# Patient Record
Sex: Female | Born: 1975 | Race: Black or African American | Hispanic: No | Marital: Single | Smoking: Current every day smoker
Health system: Southern US, Community
[De-identification: ages and names within clinical notes are randomized; demographics above are authoritative.]

## PROBLEM LIST (undated history)

## (undated) HISTORY — PX: TUBAL LIGATION: SHX77

---

## 1998-01-31 ENCOUNTER — Emergency Department (HOSPITAL_COMMUNITY): Admission: EM | Admit: 1998-01-31 | Discharge: 1998-01-31 | Payer: Self-pay | Admitting: Emergency Medicine

## 1999-01-01 ENCOUNTER — Emergency Department (HOSPITAL_COMMUNITY): Admission: EM | Admit: 1999-01-01 | Discharge: 1999-01-02 | Payer: Self-pay | Admitting: Internal Medicine

## 2000-05-30 ENCOUNTER — Emergency Department (HOSPITAL_COMMUNITY): Admission: EM | Admit: 2000-05-30 | Discharge: 2000-05-30 | Payer: Self-pay | Admitting: Emergency Medicine

## 2001-01-14 ENCOUNTER — Emergency Department (HOSPITAL_COMMUNITY): Admission: EM | Admit: 2001-01-14 | Discharge: 2001-01-14 | Payer: Self-pay | Admitting: Emergency Medicine

## 2006-02-17 ENCOUNTER — Emergency Department (HOSPITAL_COMMUNITY): Admission: EM | Admit: 2006-02-17 | Discharge: 2006-02-17 | Payer: Self-pay | Admitting: Emergency Medicine

## 2007-06-23 ENCOUNTER — Emergency Department (HOSPITAL_COMMUNITY): Admission: EM | Admit: 2007-06-23 | Discharge: 2007-06-23 | Payer: Self-pay | Admitting: Emergency Medicine

## 2007-08-18 ENCOUNTER — Emergency Department (HOSPITAL_COMMUNITY): Admission: EM | Admit: 2007-08-18 | Discharge: 2007-08-18 | Payer: Self-pay | Admitting: Family Medicine

## 2008-02-01 ENCOUNTER — Emergency Department (HOSPITAL_COMMUNITY): Admission: EM | Admit: 2008-02-01 | Discharge: 2008-02-02 | Payer: Self-pay | Admitting: *Deleted

## 2009-06-01 ENCOUNTER — Emergency Department (HOSPITAL_COMMUNITY): Admission: EM | Admit: 2009-06-01 | Discharge: 2009-06-01 | Payer: Self-pay | Admitting: Emergency Medicine

## 2009-06-22 ENCOUNTER — Emergency Department (HOSPITAL_COMMUNITY): Admission: EM | Admit: 2009-06-22 | Discharge: 2009-06-22 | Payer: Self-pay | Admitting: Family Medicine

## 2010-09-04 LAB — POCT I-STAT, CHEM 8
BUN: 7 mg/dL (ref 6–23)
Calcium, Ion: 1.17 mmol/L (ref 1.12–1.32)
HCT: 39 % (ref 36.0–46.0)
Hemoglobin: 13.3 g/dL (ref 12.0–15.0)

## 2010-09-04 LAB — POCT CARDIAC MARKERS
CKMB, poc: <1 ng/mL — ABNORMAL LOW (ref 1.0–8.0)
Myoglobin, poc: 58.6 ng/mL (ref 12–200)
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
## 2022-01-04 ENCOUNTER — Emergency Department (HOSPITAL_COMMUNITY)
Admission: EM | Admit: 2022-01-04 | Discharge: 2022-01-05 | Disposition: A | Discharge status: Home / Self Care | Payer: Self-pay | Attending: Emergency Medicine | Admitting: Emergency Medicine

## 2022-01-04 ENCOUNTER — Other Ambulatory Visit: Payer: Self-pay

## 2022-01-04 DIAGNOSIS — N12 Tubulo-interstitial nephritis, not specified as acute or chronic: Secondary | ICD-10-CM | POA: Insufficient documentation

## 2022-01-04 LAB — COMPREHENSIVE METABOLIC PANEL
ALT: 12 U/L (ref 0–44)
AST: 17 U/L (ref 15–41)
Albumin: 3.2 g/dL — ABNORMAL LOW (ref 3.5–5.0)
Alkaline Phosphatase: 70 U/L (ref 38–126)
Anion gap: 8 (ref 5–15)
BUN: 10 mg/dL (ref 6–20)
CO2: 28 mmol/L (ref 22–32)
Calcium: 9.1 mg/dL (ref 8.9–10.3)
Chloride: 103 mmol/L (ref 98–111)
Creatinine, Ser: 0.82 mg/dL (ref 0.44–1.00)
GFR, Estimated: >60 mL/min (ref >60)
Glucose, Bld: 73 mg/dL (ref 70–99)
Potassium: 3 mmol/L — ABNORMAL LOW (ref 3.5–5.1)
Sodium: 139 mmol/L (ref 135–145)
Total Bilirubin: 0.5 mg/dL (ref 0.3–1.2)
Total Protein: 7.3 g/dL (ref 6.5–8.1)

## 2022-01-04 LAB — CBC
HCT: 37.2 % (ref 36.0–46.0)
Hemoglobin: 12 g/dL (ref 12.0–15.0)
MCH: 25.2 pg — ABNORMAL LOW (ref 26.0–34.0)
MCHC: 32.3 g/dL (ref 30.0–36.0)
MCV: 78 fL — ABNORMAL LOW (ref 80.0–100.0)
Platelets: 350 10*3/uL (ref 150–400)
RBC: 4.77 MIL/uL (ref 3.87–5.11)
RDW: 15 % (ref 11.5–15.5)
WBC: 12.8 10*3/uL — ABNORMAL HIGH (ref 4.0–10.5)
nRBC: 0 % (ref 0.0–0.2)

## 2022-01-04 LAB — URINALYSIS, ROUTINE W REFLEX MICROSCOPIC
Bilirubin Urine: NEGATIVE
Glucose, UA: NEGATIVE mg/dL
Ketones, ur: NEGATIVE mg/dL
Nitrite: NEGATIVE
Protein, ur: 30 mg/dL — AB
RBC / HPF: >50 RBC/hpf — ABNORMAL HIGH (ref 0–5)
Specific Gravity, Urine: 1.01 (ref 1.005–1.030)
WBC, UA: >50 WBC/hpf — ABNORMAL HIGH (ref 0–5)
pH: 7 (ref 5.0–8.0)

## 2022-01-04 LAB — LIPASE, BLOOD: Lipase: 24 U/L (ref 11–51)

## 2022-01-04 LAB — I-STAT BETA HCG BLOOD, ED (MC, WL, AP ONLY): I-stat hCG, quantitative: <5 m[IU]/mL (ref <5)

## 2022-01-04 NOTE — ED Triage Notes (Signed)
Pt states she has had abdominal pain, pressure and urinary frequency, and nausea since 7/28.  Pt states for the past 3 days she has been vomiting as well.  Concerned for UTI.  Had tubal ligation 25 years ago.  Pt states she had sexual intercourse on 7/25 and 7/28 and that was the first time she has had intercourse in two years.  Pt states symptoms began after this.  No fever/chills/CP/or shob.

## 2022-01-05 MED ORDER — MORPHINE SULFATE (PF) 4 MG/ML IV SOLN
4.0000 mg | Freq: Once | INTRAVENOUS | Status: AC
  Administered 2022-01-05: 4 mg via INTRAVENOUS
  Filled 2022-01-05: qty 1

## 2022-01-05 MED ORDER — CEPHALEXIN 500 MG PO CAPS: 500.0000 mg | ORAL_CAPSULE | Freq: Three times a day (TID) | ORAL | 0 refills | Status: DC

## 2022-01-05 MED ORDER — ONDANSETRON HCL 4 MG/2ML IJ SOLN
4.0000 mg | Freq: Once | INTRAMUSCULAR | Status: AC
  Administered 2022-01-05: 4 mg via INTRAVENOUS
  Filled 2022-01-05: qty 2

## 2022-01-05 MED ORDER — ACETAMINOPHEN 500 MG PO TABS
1000.0000 mg | ORAL_TABLET | Freq: Four times a day (QID) | ORAL | Status: DC | PRN
  Administered 2022-01-05: 1000 mg via ORAL
  Filled 2022-01-05: qty 2

## 2022-01-05 MED ORDER — SODIUM CHLORIDE 0.9 % IV SOLN
1.0000 g | Freq: Once | INTRAVENOUS | Status: AC
  Administered 2022-01-05: 1 g via INTRAVENOUS
  Filled 2022-01-05: qty 10

## 2022-01-05 MED ORDER — ONDANSETRON 4 MG PO TBDP: 4.0000 mg | ORAL_TABLET | Freq: Three times a day (TID) | ORAL | 0 refills | Status: AC | PRN

## 2022-01-05 NOTE — ED Notes (Signed)
Triage PA and RN aware of pt's temp

## 2022-01-05 NOTE — ED Provider Notes (Signed)
[facility] EMERGENCY DEPARTMENT Provider Note   CSN: [id number] Arrival date & time: 01/04/22  2054     History  Chief Complaint  Patient presents with   Abdominal Pain   Emesis    Debbie Craig is a 46 y.o. female.  HPI     This is a 46 year old female who presents with urinary frequency, nausea, abdominal pain.  Patient reports some vomiting.  Patient states symptoms started on July 28.  She had just recently had sexual intercourse and states that her symptoms began after that.  She reports chills at home but no documented fevers.  She has had some nausea and vomiting.  Denies back pain.  Home Medications Prior to Admission medications   Medication Sig Start Date End Date Taking? Authorizing Provider  cephALEXin (KEFLEX) 500 MG capsule Take 1 capsule (500 mg total) by mouth 3 (three) times daily. 01/05/22  Yes Jatorian Renault, Mayer Masker, MD  ondansetron (ZOFRAN-ODT) 4 MG disintegrating tablet Take 1 tablet (4 mg total) by mouth every 8 (eight) hours as needed for nausea or vomiting. 01/05/22  Yes Raedyn Wenke, Mayer Masker, MD      Allergies    Patient has no known allergies.    Review of Systems   Review of Systems  Constitutional:  Positive for chills.  Gastrointestinal:  Positive for abdominal pain, nausea and vomiting.  Genitourinary:  Positive for dysuria.  All other systems reviewed and are negative.   Physical Exam Updated Vital Signs BP 114/61 (BP Location: Right Arm)   Pulse 100   Temp 99.8 F (37.7 C) (Oral)   Resp 16   SpO2 98%  Physical Exam Vitals and nursing note reviewed.  Constitutional:      Appearance: She is well-developed. She is obese. She is not ill-appearing.  HENT:     Head: Normocephalic and atraumatic.  Eyes:     Pupils: Pupils are equal, round, and reactive to light.  Cardiovascular:     Rate and Rhythm: Normal rate and regular rhythm.     Heart sounds: Normal heart sounds.  Pulmonary:     Effort: Pulmonary effort is normal.  No respiratory distress.     Breath sounds: No wheezing.  Abdominal:     General: Bowel sounds are normal.     Palpations: Abdomen is soft.     Tenderness: There is abdominal tenderness in the suprapubic area. There is no right CVA tenderness or left CVA tenderness.  Musculoskeletal:     Cervical back: Neck supple.  Skin:    General: Skin is warm and dry.  Neurological:     Mental Status: She is alert and oriented to person, place, and time.  Psychiatric:        Mood and Affect: Mood normal.     ED Results / Procedures / Treatments   Labs (all labs ordered are listed, but only abnormal results are displayed) Labs Reviewed  COMPREHENSIVE METABOLIC PANEL - Abnormal; Notable for the following components:      Result Value   Potassium 3.0 (*)    Albumin 3.2 (*)    All other components within normal limits  CBC - Abnormal; Notable for the following components:   WBC 12.8 (*)    MCV 78.0 (*)    MCH 25.2 (*)    All other components within normal limits  URINALYSIS, ROUTINE W REFLEX MICROSCOPIC - Abnormal; Notable for the following components:   APPearance CLOUDY (*)    Hgb urine dipstick MODERATE (*)  Protein, ur 30 (*)    Leukocytes,Ua LARGE (*)    RBC / HPF >50 (*)    WBC, UA >50 (*)    Bacteria, UA MANY (*)    All other components within normal limits  URINE CULTURE  LIPASE, BLOOD  I-STAT BETA HCG BLOOD, ED ([facility], [facility], [facility] ONLY)    EKG EKG Interpretation  Date/Time:  Thursday January 04 2022 21:21:58 EDT Ventricular Rate:  107 PR Interval:  162 QRS Duration: 88 QT Interval:  338 QTC Calculation: 451 R Axis:   17 Text Interpretation: Sinus tachycardia Otherwise normal ECG When compared with ECG of 01-Jun-2009 14:14, PREVIOUS ECG IS PRESENT Confirmed by Ross Marcus ([id number]) on 01/05/2022 1:55:04 AM  Radiology No results found.  Procedures Procedures    Medications Ordered in ED Medications  acetaminophen (TYLENOL) tablet 1,000 mg (1,000 mg Oral Given 01/05/22  0610)  cefTRIAXone (ROCEPHIN) 1 g in sodium chloride 0.9 % 100 mL IVPB (0 g Intravenous Stopped 01/05/22 0321)  morphine (PF) 4 MG/ML injection 4 mg (4 mg Intravenous Given 01/05/22 0419)  ondansetron (ZOFRAN) injection 4 mg (4 mg Intravenous Given 01/05/22 0419)    ED Course/ Medical Decision Making/ A&P                           Medical Decision Making Amount and/or Complexity of Data Reviewed Labs: ordered.  Risk OTC drugs. Prescription drug management.   This patient presents to the ED for concern of chills, abdominal pain., this involves an extensive number of treatment options, and is a complaint that carries with it a high risk of complications and morbidity.  I considered the following differential and admission for this acute, potentially life threatening condition.  The differential diagnosis includes UTI, pyelonephritis, kidney stone, gastritis, appendicitis, cholecystitis  MDM:    This is a 46 year old female who presents with abdominal pain, urinary symptoms.  She is nontoxic.  Vital signs initially notable for mild tachycardia.  She did develop a temperature to 100.6 while in the emergency department.  Labs obtained.  No evidence of sepsis.  She does have a leukocytosis.  Urine appears infected.  Urine culture was sent and patient was given IV Rocephin.  Given fever, suspect pyelonephritis.  Denies any back pain.  I have low suspicion for infected stone.  Patient clinically improved with pain and nausea medication as well as antibiotics.  She was able to orally hydrate.  Will discharge with Keflex 3 times daily.  (Labs, imaging, consults)  Labs: I Ordered, and personally interpreted labs.  The pertinent results include: CBC, BMP, urinalysis, urine culture  Imaging Studies ordered: I ordered imaging studies including none I independently visualized and interpreted imaging. I agree with the radiologist interpretation  Additional history obtained from chart review.  External  records from outside source obtained and reviewed including prior evaluations  Cardiac Monitoring: The patient was maintained on a cardiac monitor.  I personally viewed and interpreted the cardiac monitored which showed an underlying rhythm of: Normal sinus rhythm  Reevaluation: After the interventions noted above, I reevaluated the patient and found that they have :improved  Social Determinants of Health: Lives independently  Disposition: Discharge  Co morbidities that complicate the patient evaluation No past medical history on file.   Medicines Meds ordered this encounter  Medications   cefTRIAXone (ROCEPHIN) 1 g in sodium chloride 0.9 % 100 mL IVPB    Order Specific Question:   Antibiotic Indication:  Answer:   UTI   morphine (PF) 4 MG/ML injection 4 mg   ondansetron (ZOFRAN) injection 4 mg   acetaminophen (TYLENOL) tablet 1,000 mg   cephALEXin (KEFLEX) 500 MG capsule    Sig: Take 1 capsule (500 mg total) by mouth 3 (three) times daily.    Dispense:  21 capsule    Refill:  0   ondansetron (ZOFRAN-ODT) 4 MG disintegrating tablet    Sig: Take 1 tablet (4 mg total) by mouth every 8 (eight) hours as needed for nausea or vomiting.    Dispense:  20 tablet    Refill:  0    I have reviewed the patients home medicines and have made adjustments as needed  Problem List / ED Course: Problem List Items Addressed This Visit   None Visit Diagnoses     Pyelonephritis    -  Primary   Relevant Medications   cefTRIAXone (ROCEPHIN) 1 g in sodium chloride 0.9 % 100 mL IVPB (Completed)   cephALEXin (KEFLEX) 500 MG capsule                   Final Clinical Impression(s) / ED Diagnoses Final diagnoses:  Pyelonephritis    Rx / DC Orders ED Discharge Orders          Ordered    cephALEXin (KEFLEX) 500 MG capsule  3 times daily        01/05/22 0611    ondansetron (ZOFRAN-ODT) 4 MG disintegrating tablet  Every 8 hours PRN        01/05/22 0611               Shon Baton, MD 01/05/22 [phone]

## 2022-01-05 NOTE — ED Notes (Signed)
Patient is bundled up with a personal blanket and hospital blankets, pt requested another blanket bc she is cold. RN explained to patient her body is warm enough and should reduce the amount of blankets provided, [place]ally 100.51F temp. Pt proceeded with covering herself with the blankets. MD made aware.

## 2022-01-05 NOTE — Discharge Instructions (Signed)
You were seen today for urinary symptoms and fever.  You have evidence of a urinary tract infection.  Given the fever, this is likely pyelonephritis.  Take antibiotics as prescribed.  Make sure to stay hydrated.  Use Tylenol or ibuprofen for pain and fevers.  If you develop worsening pain, fevers, any new or worsening symptoms, you should be reevaluated.

## 2022-01-06 ENCOUNTER — Other Ambulatory Visit: Payer: Self-pay

## 2022-01-06 ENCOUNTER — Encounter (HOSPITAL_BASED_OUTPATIENT_CLINIC_OR_DEPARTMENT_OTHER): Payer: Self-pay | Admitting: Emergency Medicine

## 2022-01-06 ENCOUNTER — Emergency Department (HOSPITAL_BASED_OUTPATIENT_CLINIC_OR_DEPARTMENT_OTHER)
Admission: EM | Admit: 2022-01-06 | Discharge: 2022-01-06 | Disposition: A | Discharge status: Home / Self Care | Payer: Self-pay | Attending: Emergency Medicine | Admitting: Emergency Medicine

## 2022-01-06 DIAGNOSIS — N3001 Acute cystitis with hematuria: Secondary | ICD-10-CM | POA: Insufficient documentation

## 2022-01-06 LAB — WET PREP, GENITAL
Clue Cells Wet Prep HPF POC: NONE SEEN
Sperm: NONE SEEN
Trich, Wet Prep: NONE SEEN
WBC, Wet Prep HPF POC: <10 (ref <10)
Yeast Wet Prep HPF POC: NONE SEEN

## 2022-01-06 LAB — URINALYSIS, ROUTINE W REFLEX MICROSCOPIC
Bilirubin Urine: NEGATIVE
Glucose, UA: NEGATIVE mg/dL
Ketones, ur: NEGATIVE mg/dL
Nitrite: NEGATIVE
Protein, ur: 30 mg/dL — AB
Specific Gravity, Urine: 1.014 (ref 1.005–1.030)
WBC, UA: >50 WBC/hpf — ABNORMAL HIGH (ref 0–5)
pH: 6.5 (ref 5.0–8.0)

## 2022-01-06 LAB — URINE CULTURE

## 2022-01-06 MED ORDER — SULFAMETHOXAZOLE-TRIMETHOPRIM 800-160 MG PO TABS: 1.0000 | ORAL_TABLET | Freq: Two times a day (BID) | ORAL | 0 refills | Status: AC

## 2022-01-06 NOTE — ED Notes (Signed)
Pt provided urine sample, sample taken to lab.

## 2022-01-06 NOTE — ED Triage Notes (Signed)
Pt via pov from home with ongoing fever, chills, papinful and frequent urination. Was put on medication Thursday for the same after visiting [facility] and has had no relief. Pt alert & oriented, nad noted. Pt recently had intercourse with her husband after a couple of years, and her husband told her his partner had bacterial vaginosis. Pt alert & oriented, nad noted.

## 2022-01-06 NOTE — ED Provider Notes (Signed)
[facility] EMERGENCY DEPT Provider Note   CSN: [id number] Arrival date & time: 01/06/22  1054     History  Chief Complaint  Patient presents with   Chills   Fever    Debbie Craig is a 46 y.o. female.  46 year old female presents with ongoing sweats, urinary pressure and frequency. Patient was seen yesterday for same, diagnosed with a UTI, given Rocephin and discharged on Keflex.  She presents today feeling like her symptoms have not resolved although has not been febrile since leaving the ER yesterday.  Denies vomiting, changes in bowel habits or vaginal discharge.  Denies pelvic pain.  Patient states that she had intercourse with her husband for the first time in 2 years the other day, her symptoms started afterwards.  Her husband admitted to messing around with another female and states that she has a history of BV.       Home Medications Prior to Admission medications   Medication Sig Start Date End Date Taking? Authorizing Provider  sulfamethoxazole-trimethoprim (BACTRIM DS) 800-160 MG tablet Take 1 tablet by mouth 2 (two) times daily for 7 days. 01/06/22 01/13/22 Yes Jeannie Fend, PA-C  ondansetron (ZOFRAN-ODT) 4 MG disintegrating tablet Take 1 tablet (4 mg total) by mouth every 8 (eight) hours as needed for nausea or vomiting. 01/05/22   Horton, Mayer Masker, MD      Allergies    Patient has no known allergies.    Review of Systems   Review of Systems Negative except as per HPI Physical Exam Updated Vital Signs BP (!) 146/85 (BP Location: Right Arm)   Pulse 70   Temp 98.2 F (36.8 C) (Oral)   Resp 15   Ht 5\' 3"  (1.6 m)   Wt 99.3 kg   LMP 12/06/2021 (Exact Date)   SpO2 100%   BMI 38.79 kg/m  Physical Exam Vitals and nursing note reviewed.  Constitutional:      General: She is not in acute distress.    Appearance: She is well-developed. She is not diaphoretic.  HENT:     Head: Normocephalic and atraumatic.  Cardiovascular:     Rate and  Rhythm: Normal rate and regular rhythm.     Pulses: Normal pulses.     Heart sounds: Normal heart sounds.  Pulmonary:     Effort: Pulmonary effort is normal.     Breath sounds: Normal breath sounds.  Abdominal:     Palpations: Abdomen is soft.     Tenderness: There is no abdominal tenderness. There is no right CVA tenderness or left CVA tenderness.  Musculoskeletal:     Cervical back: Neck supple.     Right lower leg: No edema.     Left lower leg: No edema.  Skin:    General: Skin is warm and dry.     Findings: No erythema or rash.  Neurological:     Mental Status: She is alert and oriented to person, place, and time.  Psychiatric:        Behavior: Behavior normal.     ED Results / Procedures / Treatments   Labs (all labs ordered are listed, but only abnormal results are displayed) Labs Reviewed  URINALYSIS, ROUTINE W REFLEX MICROSCOPIC - Abnormal; Notable for the following components:      Result Value   Hgb urine dipstick TRACE (*)    Protein, ur 30 (*)    Leukocytes,Ua MODERATE (*)    WBC, UA >50 (*)    Bacteria, UA RARE (*)  All other components within normal limits  WET PREP, GENITAL  URINE CULTURE  GC/CHLAMYDIA PROBE AMP ([place]) NOT AT [facility]    EKG None  Radiology No results found.  Procedures Procedures    Medications Ordered in ED Medications - No data to display  ED Course/ Medical Decision Making/ A&P                           Medical Decision Making Amount and/or Complexity of Data Reviewed Labs: ordered.  Risk Prescription drug management.   This patient presents to the ED for concern of ongoing UTI with concern for sweats, urinary pressure and frequency, this involves an extensive number of treatment options, and is a complaint that carries with it a high risk of complications and morbidity.  The differential diagnosis includes UTI, STD.  Consider PID versus TOA however denies vaginal discharge or pelvic pain.   Co morbidities  that complicate the patient evaluation  No significant past medical history   Additional history obtained:  External records from outside source obtained and reviewed including urine culture report from visit to the ER 2 days ago, specimen with multiple species, recommends recollection.   Lab Tests:  I Ordered, and personally interpreted labs.  The pertinent results include: Wet prep is negative for yeast, trichomoniasis, clue cells.  Urinalysis positive for protein with moderate leukocytes, 11-20 red cells, greater than 50 white cells and rare bacteria.  Problem List / ED Course / Critical interventions / Medication management  46 year old female returns the ER after work-up 2 days ago where she was diagnosed with pyelonephritis.  Her urine unfortunately that time was contaminated and her urine culture recommends recollection, unable to isolate a particular bacteria requiring treatment.  Patient has, been compliant with her Keflex.  Her fever has resolved however she has sweats and feels poorly.  Patient is concerned about STD given that her husband recently had a relationship with another woman and her symptoms started after having intercourse with him again she denies pelvic pain or abnormal vaginal discharge, her abdomen soft nontender, no doubt pelvic infection.  We will add on gonorrhea chlamydia testing to her urine.  Her wet prep is reassuring.  Offered to change patient's antibiotic to Bactrim versus wait for culture report from repeat collection today as her sample today is less contaminated does show improvement compared to yesterday sample. I have reviewed the patients home medicines and have made adjustments as needed   Social Determinants of Health:  No PCP on file   Test / Admission - Considered:  Offered pelvic exam, patient defers.  Offered pelvic ultrasound for further evaluation pelvic infection, patient defers at this time.         Final Clinical Impression(s)  / ED Diagnoses Final diagnoses:  Acute cystitis with hematuria    Rx / DC Orders ED Discharge Orders          Ordered    sulfamethoxazole-trimethoprim (BACTRIM DS) 800-160 MG tablet  2 times daily        01/06/22 1407              Alden Hipp 01/06/22 2209    Alvira Monday, MD 01/07/22 [phone]

## 2022-01-06 NOTE — Discharge Instructions (Signed)
Take antibiotics as prescribed and complete the full course.  Return to the ER for worsening or concerning symptoms otherwise recheck with your primary care provider on Monday.  Follow-up in your MyChart account for your gonorrhea chlamydia test results.  If these are positive, you may need additional treatment.  If either of these tests are positive, consider following up with the health department for HIV and syphilis testing.

## 2022-01-07 LAB — URINE CULTURE: Culture: NO GROWTH

## 2022-01-08 LAB — GC/CHLAMYDIA PROBE AMP (~~LOC~~) NOT AT ARMC
Chlamydia: NEGATIVE
Comment: NEGATIVE
Comment: NORMAL
Neisseria Gonorrhea: NEGATIVE

## 2023-07-14 ENCOUNTER — Other Ambulatory Visit: Payer: Self-pay

## 2023-07-14 ENCOUNTER — Emergency Department (HOSPITAL_BASED_OUTPATIENT_CLINIC_OR_DEPARTMENT_OTHER)
Admission: EM | Admit: 2023-07-14 | Discharge: 2023-07-14 | Disposition: A | Discharge status: Home / Self Care | Payer: Self-pay | Attending: Emergency Medicine | Admitting: Emergency Medicine

## 2023-07-14 DIAGNOSIS — R739 Hyperglycemia, unspecified: Secondary | ICD-10-CM

## 2023-07-14 DIAGNOSIS — R350 Frequency of micturition: Secondary | ICD-10-CM | POA: Insufficient documentation

## 2023-07-14 DIAGNOSIS — R7309 Other abnormal glucose: Secondary | ICD-10-CM | POA: Insufficient documentation

## 2023-07-14 DIAGNOSIS — R0981 Nasal congestion: Secondary | ICD-10-CM | POA: Insufficient documentation

## 2023-07-14 LAB — URINALYSIS, ROUTINE W REFLEX MICROSCOPIC
Bilirubin Urine: NEGATIVE
Glucose, UA: NEGATIVE mg/dL
Hgb urine dipstick: NEGATIVE
Ketones, ur: NEGATIVE mg/dL
Leukocytes,Ua: NEGATIVE
Nitrite: NEGATIVE
Specific Gravity, Urine: 1.011 (ref 1.005–1.030)
pH: 6 (ref 5.0–8.0)

## 2023-07-14 LAB — CBG MONITORING, ED: Glucose-Capillary: 116 mg/dL — ABNORMAL HIGH (ref 70–99)

## 2023-07-14 LAB — PREGNANCY, URINE: Preg Test, Ur: NEGATIVE

## 2023-07-14 MED ORDER — ACETAMINOPHEN 325 MG PO TABS
650.0000 mg | ORAL_TABLET | Freq: Once | ORAL | Status: AC
  Administered 2023-07-14: 650 mg via ORAL
  Filled 2023-07-14: qty 2

## 2023-07-14 MED ORDER — KETOROLAC TROMETHAMINE 15 MG/ML IJ SOLN
15.0000 mg | Freq: Once | INTRAMUSCULAR | Status: AC
  Administered 2023-07-14: 15 mg via INTRAMUSCULAR
  Filled 2023-07-14: qty 1

## 2023-07-14 MED ORDER — KETOROLAC TROMETHAMINE 15 MG/ML IJ SOLN: 15.0000 mg | Freq: Once | INTRAMUSCULAR | Status: DC

## 2023-07-14 NOTE — ED Notes (Signed)
 Reviewed AVS/discharge instruction with patient. Time allotted for and all questions answered. Patient is agreeable for d/c and escorted to ed exit by staff.

## 2023-07-14 NOTE — ED Triage Notes (Signed)
 Dx w/ fluA on Thursday. States had increased urinary frequency and fatigue. Checked CBG at home and was 350s.

## 2023-07-14 NOTE — Discharge Instructions (Addendum)
 You were seen in the ER today for evaluation of your urinary frequency.  Your urine does not show any signs of infection.  I have sent in for culture.  If it grows back anything, someone will call you in antibiotic.  Your glucose is mildly elevated at 116.  This is not a confirmation for diabetes.  As we discussed, you will need to follow-up with your primary care doctor for this.  I have listed the information for 1 in the discharge paperwork.  Please call to schedule an appointment.  Make sure that you are staying hydrated drinking plenty of fluids, mainly water.  I recommend taking Tylenol  or ibuprofen as needed for your headache.  If you start to have any worsening headache, vision changes, trouble walking, trouble talking, not making any urine, pain with urination, abdominal pain, flank pain, fever, please return to nearest Emergency Department for reevaluation.  If you have any other concerns, new or worsening symptoms, please return to the nearest for department for evaluation.  Contact a health care provider if: You start urinating more often. You feel pain or irritation when you urinate. You notice blood in your urine. Your urine looks cloudy. You develop a fever. You begin vomiting. You have diabetes and have trouble keeping your blood sugar in the right range. Your blood sugar is at or above 240 mg/dL (86.6 mmol/L) for 2 days in a row. You have high blood sugar often. You have signs of illness, such as: Nausea or vomiting. A headache. A fever. You can't stop vomiting.  Get help right away if: You are unable to urinate. Your blood sugar monitor reads high even when you're taking insulin. You have trouble breathing. These symptoms may be an emergency. Call 911 right away. Do not wait to see if the symptoms will go away. Do not drive yourself to the hospital.

## 2023-07-14 NOTE — ED Provider Notes (Signed)
 [facility]GENC[facility] DEPARTMENT AT [facility] Provider Note   CSN: [id number] Arrival date & time: 07/14/23  1154     History} Chief Complaint  Patient presents with   Urinary Frequency    Debbie Craig is a 48 y.o. female self reportedly otherwise healthy presents to the ER today for evaluation of increased urinary frequency for the past 4 days since being diagnosed with the flu. She denies any dysuria, hematuria, belly pain, flank pain, nausea, vomiting, fever.  She reports that she is only urinating small amount, denies polyuria.  She reports that she used her cousins glucometer and showed improved glucose of 350 shortly prior to arrival.  She denies a history of diabetes.  She has not taken any medications for flulike symptoms.  She also reports that she had headache and was concerned because she did not think you had headaches with the flu.  She is describes this as more pressure to the frontal sinuses.  Having some runny nose and nasal congestion as well.  No visual loss or trouble walking or talking.  She has not tried any medications for symptoms.  No known drug allergies.   Urinary Frequency Associated symptoms include headaches. Pertinent negatives include no chest pain, no abdominal pain and no shortness of breath.       Home Medications Prior to Admission medications   Medication Sig Start Date End Date Taking? Authorizing Provider  ondansetron  (ZOFRAN -ODT) 4 MG disintegrating tablet Take 1 tablet (4 mg total) by mouth every 8 (eight) hours as needed for nausea or vomiting. 01/05/22   Horton, Charmaine FALCON, MD      Allergies    Patient has no known allergies.    Review of Systems   Review of Systems  Constitutional:  Negative for chills and fever.  HENT:  Positive for congestion, rhinorrhea and sinus pressure. Negative for sore throat.   Respiratory:  Negative for shortness of breath.   Cardiovascular:  Negative for chest pain.  Gastrointestinal:  Negative for  abdominal pain, constipation, diarrhea, nausea and vomiting.  Endocrine: Negative for polyuria.  Genitourinary:  Positive for frequency and urgency. Negative for dysuria, flank pain, hematuria, vaginal bleeding and vaginal discharge.  Musculoskeletal:  Negative for neck pain and neck stiffness.  Neurological:  Positive for headaches.    Physical Exam Updated Vital Signs BP 119/68   Pulse 73   Temp 98.1 F (36.7 C) (Oral)   Resp 18   SpO2 100%  Physical Exam Vitals and nursing note reviewed.  Constitutional:      General: She is not in acute distress.    Appearance: She is not ill-appearing or toxic-appearing.     Comments: On the phone in no acute distress  HENT:     Nose: Congestion present.     Mouth/Throat:     Mouth: Mucous membranes are moist.  Eyes:     General: No scleral icterus.    Extraocular Movements: Extraocular movements intact.     Pupils: Pupils are equal, round, and reactive to light.  Cardiovascular:     Rate and Rhythm: Normal rate.  Pulmonary:     Effort: Pulmonary effort is normal. No respiratory distress.  Abdominal:     Palpations: Abdomen is soft.     Tenderness: There is no abdominal tenderness.  Musculoskeletal:     Cervical back: Normal range of motion. No rigidity.     Right lower leg: No edema.     Left lower leg: No edema.  Lymphadenopathy:  Cervical: No cervical adenopathy.  Skin:    General: Skin is warm and dry.  Neurological:     General: No focal deficit present.     Mental Status: She is alert.     Cranial Nerves: No cranial nerve deficit.     Sensory: No sensory deficit.     Motor: No weakness.     ED Results / Procedures / Treatments   Labs (all labs ordered are listed, but only abnormal results are displayed) Labs Reviewed  URINALYSIS, ROUTINE W REFLEX MICROSCOPIC - Abnormal; Notable for the following components:      Result Value   Protein, ur TRACE (*)    All other components within normal limits  CBG MONITORING,  ED - Abnormal; Notable for the following components:   Glucose-Capillary 116 (*)    All other components within normal limits  URINE CULTURE  PREGNANCY, URINE    EKG None  Radiology No results found.  Procedures Procedures   Medications Ordered in ED Medications - No data to display  ED Course/ Medical Decision Making/ A&P    Medical Decision Making Amount and/or Complexity of Data Reviewed Labs: ordered.  Risk OTC drugs. Prescription drug management.   48 y.o. female presents to the ER for evaluation of urinary urgency/frequency, HA, and elevated CBG. Differential diagnosis includes but is not limited to DM, DKA, lab error, UTI, interstitial cystitis, HA, meningitis. Vital signs unremarkable. Physical exam as noted above.   I independently reviewed and interpreted the patient's labs. Urine shows trace amount of protein that has been present in multiple other urinalysis in her history.  It does not show any signs of ketones or glucose present.  Her pregnancy test is negative.  CBG on arrival was 116.  Urine culture in process given symptoms.  Given the slightly elevated blood glucose but lack of glucose or ketones in the urine.  I do not see a need to order any additional labs emergently.  She does not appear to be in DKA.  This likely may be a machine error.  I instructed the patient to make sure her cousin's glucometer is calibrated.  Recommended checking the test trips as well.  She can follow-up with a primary care doctor for further evaluation of this.  For the headache, it is sounds more like sinus pressure.  She has no tenderness over the temples.  No visual changes.  Doubt any meningitis or temporal arteritis.  She has no nuchal rigidity.  She has not tried any medications for pain.  Will give her some Toradol  as she felt that this helped her headache when she was seen on Thursday at a clinic.  Will also order her some Tylenol  as well.  I discussed with her about the UTI  symptoms that does not show any signs of infection however we will order a culture.  Discussed with her if this does grow to bacteria that someone will call her in a prescription to make sure she answers the phone.  Patient is hemodynamically stable.  She does not have any CVA tenderness or abdominal tenderness.  She is not having any nausea or vomiting.  She is stable for discharge home with supportive care measures.  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  I think this is reasonable.  Will extend her work note.  We discussed the results of the labs/imaging. The plan is supportive care, follow up with PCP. We discussed strict return precautions and  red flag symptoms. The patient verbalized their understanding and agrees to the plan. The patient is stable and being discharged home in good condition.  Portions of this report may have been transcribed using voice recognition software. Every effort was made to ensure accuracy; however, inadvertent computerized transcription errors may be present.   I discussed this case with my attending physician who cosigned this note including patient's presenting symptoms, physical exam, and planned diagnostics and interventions. Attending physician stated agreement with plan or made changes to plan which were implemented.   Final Clinical Impression(s) / ED Diagnoses Final diagnoses:  Elevated blood sugar  Urinary frequency    Rx / DC Orders ED Discharge Orders     None         Bernis Ernst, PA-C 07/15/23 9040    Ruthe Cornet, DO 07/17/23 1449

## 2023-07-15 LAB — URINE CULTURE: Culture: <10000 — AB
